# Patient Record
Sex: Female | Born: 1965 | Race: White | Hispanic: No | Marital: Married | Smoking: Never smoker
Health system: Southern US, Community
[De-identification: ages and names within clinical notes are randomized; demographics above are authoritative.]

## PROBLEM LIST (undated history)

## (undated) DIAGNOSIS — E079 Disorder of thyroid, unspecified: Secondary | ICD-10-CM

## (undated) DIAGNOSIS — G43909 Migraine, unspecified, not intractable, without status migrainosus: Secondary | ICD-10-CM

## (undated) HISTORY — PX: APPENDECTOMY: SHX54

## (undated) HISTORY — PX: TONSILLECTOMY: SUR1361

## (undated) HISTORY — PX: TUBAL LIGATION: SHX77

---

## 2015-05-06 ENCOUNTER — Ambulatory Visit
Admission: EM | Admit: 2015-05-06 | Discharge: 2015-05-06 | Disposition: A | Discharge status: Home / Self Care | Payer: Federal, State, Local not specified - PPO | Attending: Family Medicine | Admitting: Family Medicine

## 2015-05-06 ENCOUNTER — Encounter: Payer: Self-pay | Admitting: Emergency Medicine

## 2015-05-06 DIAGNOSIS — L03211 Cellulitis of face: Secondary | ICD-10-CM

## 2015-05-06 HISTORY — DX: Disorder of thyroid, unspecified: E07.9

## 2015-05-06 HISTORY — DX: Migraine, unspecified, not intractable, without status migrainosus: G43.909

## 2015-05-06 MED ORDER — SULFAMETHOXAZOLE-TRIMETHOPRIM 800-160 MG PO TABS: 1.0000 | ORAL_TABLET | Freq: Two times a day (BID) | ORAL | Status: DC

## 2015-05-06 NOTE — ED Notes (Signed)
Pt with swelling left side of face x 2 days and a fever

## 2015-05-06 NOTE — Discharge Instructions (Signed)

## 2015-05-06 NOTE — ED Provider Notes (Signed)
CSN: [id number]     Arrival date & time 05/06/15  1429 History   First MD Initiated Contact with Patient 05/06/15 1502     Chief Complaint  Patient presents with  . Facial Pain   (Consider location/radiation/quality/duration/timing/severity/associated sxs/prior Treatment) HPI Comments: 49 yo female with a 2 days h/o left sided facial skin pain, redness and fevers. Denies any trauma, injuries, known insect bite, sick contacts, drainage, congestion, earache, cough, sore throat.   The history is provided by the patient.    Past Medical History  Diagnosis Date  . Thyroid disease   . Migraine    Past Surgical History  Procedure Laterality Date  . Tonsillectomy    . Appendectomy    . Tubal ligation     Family History  Problem Relation Age of Onset  . Cancer Mother   . Cancer Father    History  Substance Use Topics  . Smoking status: Never Smoker   . Smokeless tobacco: Never Used  . Alcohol Use: No   OB History    No data available     Review of Systems  Allergies  Rocephin  Home Medications   Prior to Admission medications   Medication Sig Start Date End Date Taking? Authorizing Provider  levothyroxine (SYNTHROID, LEVOTHROID) 25 MCG tablet Take 25 mcg by mouth daily before breakfast.   Yes Historical Provider, MD  magnesium 30 MG tablet Take 30 mg by mouth daily.   Yes Historical Provider, MD  sulfamethoxazole-trimethoprim (BACTRIM DS,SEPTRA DS) 800-160 MG per tablet Take 1 tablet by mouth 2 (two) times daily. 05/06/15   Payton Mccallum, MD   BP 136/84 mmHg  Pulse 123  Temp(Src) 101.8 F (38.8 C) (Oral)  Resp 18  Ht  (1.6 m)  Wt 180 lb (81.647 kg)  BMI 31.89 kg/m2  SpO2 98% Physical Exam  Constitutional: She appears well-developed and well-nourished. No distress.  Skin: Rash noted. She is not diaphoretic. There is erythema.  6x3cm skin area on left side face and jaw line with blanchable erythema, warmth, and tenderness to palpation; no drainage or vesicular  lesions noted.   Nursing note and vitals reviewed.   ED Course  Procedures (including critical care time) Labs Review Labs Reviewed - No data to display  Imaging Review No results found.   MDM   1. Cellulitis, face   (left)  Discharge Medication List as of 05/06/2015  3:22 PM    START taking these medications   Details  sulfamethoxazole-trimethoprim (BACTRIM DS,SEPTRA DS) 800-160 MG per tablet Take 1 tablet by mouth 2 (two) times daily., Starting 05/06/2015, Until Discontinued, Normal       Plan: 1. diagnosis reviewed with patient 2. rx as per orders; risks, benefits, potential side effects reviewed with patient; (note: allergic to cephalosporins) 3. Recommend supportive treatment with warm compresses to area, otc analgesics 4. close monitoring and to ED if symptoms worsen 5.  F/u prn if symptoms worsen or don't improve    Payton Mccallum, MD 05/06/15 1530

## 2015-08-13 ENCOUNTER — Other Ambulatory Visit: Payer: Self-pay | Admitting: Neurology

## 2015-08-13 DIAGNOSIS — R51 Headache: Principal | ICD-10-CM

## 2015-08-13 DIAGNOSIS — R519 Headache, unspecified: Secondary | ICD-10-CM

## 2015-08-21 ENCOUNTER — Ambulatory Visit: Admission: RE | Admit: 2015-08-21 | Payer: Federal, State, Local not specified - PPO | Source: Ambulatory Visit

## 2015-08-30 ENCOUNTER — Ambulatory Visit
Admission: RE | Admit: 2015-08-30 | Discharge: 2015-08-30 | Disposition: A | Discharge status: Home / Self Care | Payer: Federal, State, Local not specified - PPO | Source: Ambulatory Visit | Attending: Neurology | Admitting: Neurology

## 2015-08-30 DIAGNOSIS — R51 Headache: Secondary | ICD-10-CM | POA: Insufficient documentation

## 2015-08-30 DIAGNOSIS — R519 Headache, unspecified: Secondary | ICD-10-CM

## 2015-08-30 LAB — CBC
HCT: 41.3 % (ref 35.0–47.0)
Hemoglobin: 13.5 g/dL (ref 12.0–16.0)
MCH: 30 pg (ref 26.0–34.0)
MCHC: 32.7 g/dL (ref 32.0–36.0)
MCV: 91.9 fL (ref 80.0–100.0)
PLATELETS: 294 10*3/uL (ref 150–440)
RBC: 4.49 MIL/uL (ref 3.80–5.20)
RDW: 13.2 % (ref 11.5–14.5)
WBC: 5.9 10*3/uL (ref 3.6–11.0)

## 2015-08-30 LAB — CSF CELL COUNT WITH DIFFERENTIAL
Eosinophils, CSF: 0 %
LYMPHS CSF: 0 %
Monocyte-Macrophage-Spinal Fluid: 0 %
Other Cells, CSF: 0
RBC COUNT CSF: 13 /mm3 — AB (ref 0–3)
SEGMENTED NEUTROPHILS-CSF: 0 %
TUBE #: 3
WBC, CSF: 0 /mm3

## 2015-08-30 LAB — PROTIME-INR
INR: 0.95
Prothrombin Time: 12.9 seconds (ref 11.4–15.0)

## 2015-08-30 LAB — ALBUMIN: Albumin: 4.1 g/dL (ref 3.5–5.0)

## 2015-08-30 LAB — APTT: aPTT: 28 seconds (ref 24–36)

## 2015-08-30 LAB — GLUCOSE, CSF: Glucose, CSF: 62 mg/dL (ref 40–70)

## 2015-08-30 LAB — PROTEIN, CSF: TOTAL PROTEIN, CSF: 31 mg/dL (ref 15–45)

## 2015-08-30 NOTE — Progress Notes (Signed)
Pt doing ok post procedure, husband present, no changes, bandade dressing intact,discharge instructions given with questions answered,

## 2015-08-30 NOTE — Discharge Instructions (Signed)
Lumbar Puncture, Care After °Refer to this sheet in the next few weeks. These instructions provide you with information on caring for yourself after your procedure. Your health care provider may also give you more specific instructions. Your treatment has been planned according to current medical practices, but problems sometimes occur. Call your health care provider if you have any problems or questions after your procedure. °WHAT TO EXPECT AFTER THE PROCEDURE °After your procedure, it is typical to have the following sensations: °· Mild discomfort or pain at the insertion site. °· Mild headache that is relieved with pain medicines. °HOME CARE INSTRUCTIONS °· Avoid lifting anything heavier than 10 lb (4.5 kg) for at least 12 hours after the procedure. °· Drink enough fluids to keep your urine clear or pale yellow. °SEEK MEDICAL CARE IF: °· You have fever or chills. °· You have nausea or vomiting. °· You have a headache that lasts for more than 2 days. °SEEK IMMEDIATE MEDICAL CARE IF: °· You have any numbness or tingling in your legs. °· You are unable to control your bowel or bladder. °· You have bleeding or swelling in your back at the insertion site. °· You are dizzy or faint. °  °This information is not intended to replace advice given to you by your health care provider. Make sure you discuss any questions you have with your health care provider. °  °Document Released: 09/19/2013 Document Reviewed: 09/19/2013 °Elsevier Interactive Patient Education ©2016 Elsevier Inc[employer] ° °

## 2015-09-03 LAB — CSF CULTURE: Culture: NO GROWTH

## 2015-09-03 LAB — CSF CULTURE W GRAM STAIN

## 2015-09-04 LAB — LYME, WESTERN BLOT, SERUM (REFLEXED)
IGG P28 AB.: ABSENT
IGG P45 AB.: ABSENT
IGG P66 AB.: ABSENT
IgG P23 Ab.: ABSENT
IgG P30 Ab.: ABSENT
IgM P39 Ab.: ABSENT
LYME IGG WB: POSITIVE — AB
LYME IGM WB: POSITIVE — AB

## 2015-09-04 LAB — IGG CSF INDEX
Albumin CSF-mCnc: 24 mg/dL (ref 11–48)
Albumin: 4.3 g/dL (ref 3.5–5.5)
CSF IgG Index: 0.5 (ref 0.0–0.7)
IGG (IMMUNOGLOBIN G), SERUM: 1160 mg/dL (ref 700–1600)
IGG CSF: 3.1 mg/dL (ref 0.0–8.6)
IgG/Alb Ratio, CSF: 0.13 (ref 0.00–0.25)

## 2015-09-04 LAB — B. BURGDORFI ANTIBODIES: B burgdorferi Ab IgG+IgM: 1.56 {ISR} — ABNORMAL HIGH (ref 0.00–0.90)

## 2015-09-06 LAB — OLIGOCLONAL BANDS, CSF + SERM

## 2015-09-06 LAB — JC VIRUS, PCR CSF: JC VIRUS PCR (CSF): NEGATIVE

## 2015-10-25 ENCOUNTER — Other Ambulatory Visit: Payer: Self-pay | Admitting: Neurology

## 2015-10-25 DIAGNOSIS — G44221 Chronic tension-type headache, intractable: Secondary | ICD-10-CM

## 2015-11-14 ENCOUNTER — Ambulatory Visit
Admission: RE | Admit: 2015-11-14 | Discharge: 2015-11-14 | Disposition: A | Discharge status: Home / Self Care | Payer: Federal, State, Local not specified - PPO | Source: Ambulatory Visit | Attending: Neurology | Admitting: Neurology

## 2015-11-14 DIAGNOSIS — G44221 Chronic tension-type headache, intractable: Secondary | ICD-10-CM | POA: Insufficient documentation

## 2016-01-08 ENCOUNTER — Ambulatory Visit
Admission: EM | Admit: 2016-01-08 | Discharge: 2016-01-08 | Disposition: A | Discharge status: Home / Self Care | Payer: Federal, State, Local not specified - PPO | Attending: Family Medicine | Admitting: Family Medicine

## 2016-01-08 ENCOUNTER — Encounter: Payer: Self-pay | Admitting: Emergency Medicine

## 2016-01-08 DIAGNOSIS — G43711 Chronic migraine without aura, intractable, with status migrainosus: Secondary | ICD-10-CM | POA: Diagnosis not present

## 2016-01-08 MED ORDER — ONDANSETRON 8 MG PO TBDP
8.0000 mg | ORAL_TABLET | Freq: Once | ORAL | Status: AC
  Administered 2016-01-08: 8 mg via ORAL

## 2016-01-08 MED ORDER — KETOROLAC TROMETHAMINE 60 MG/2ML IM SOLN
60.0000 mg | Freq: Once | INTRAMUSCULAR | Status: AC
  Administered 2016-01-08: 60 mg via INTRAMUSCULAR

## 2016-01-08 MED ORDER — SUMATRIPTAN SUCCINATE 6 MG/0.5ML ~~LOC~~ SOLN
6.0000 mg | Freq: Once | SUBCUTANEOUS | Status: AC
  Administered 2016-01-08: 6 mg via SUBCUTANEOUS

## 2016-01-08 MED ORDER — ONDANSETRON 8 MG PO TBDP: 8.0000 mg | ORAL_TABLET | Freq: Three times a day (TID) | ORAL | Status: DC | PRN

## 2016-01-08 NOTE — ED Provider Notes (Signed)
CSN: [id number]     Arrival date & time 01/08/16  1132 History   First MD Initiated Contact with Patient 01/08/16 1205    Nurses notes were reviewed. Chief Complaint  Patient presents with  . Migraine  . Dizziness   Patient's here because migraine headache. She rates reports having chronic migraines. She sees and is followed by neurologist. She states she has daily headaches with the anti-seizure-like medication which she cannot give [place] the name does not seem to be helping at all. She states that she's had headache for several days. She try to get in to see a neurologist today and recommended her going to urgent care. She's had MRIs in the last few years and looking up her MRI shows no acute changes other than some nonspecific findings the [address] [place] sick to be compatible with some with migraines. She's had nausea as well. She is allergic to Rocephin has been on Imitrex before practice doesn't smoke work for her and she does have nausea today. He states that she is not aware of ever taking Toradol either.   She's had tonsillectomy appendectomy tubal ligation. She never smoked. Mother father both had cancer.    (Consider location/radiation/quality/duration/timing/severity/associated sxs/prior Treatment) Patient is a 50 y.o. female presenting with migraines and dizziness. The history is provided by the patient. No language interpreter was used.  Migraine This is a chronic problem. The current episode started more than 2 days ago. The problem occurs constantly. The problem has been gradually worsening. Associated symptoms include headaches. Pertinent negatives include no chest pain, no abdominal pain and no shortness of breath. Nothing aggravates the symptoms. Nothing relieves the symptoms. The treatment provided no relief.  Dizziness Associated symptoms: headaches and nausea   Associated symptoms: no chest pain and no shortness of breath     Past Medical History  Diagnosis Date  . Thyroid  disease   . Migraine    Past Surgical History  Procedure Laterality Date  . Tonsillectomy    . Appendectomy    . Tubal ligation     Family History  Problem Relation Age of Onset  . Cancer Mother   . Cancer Father    Social History  Substance Use Topics  . Smoking status: Never Smoker   . Smokeless tobacco: Never Used  . Alcohol Use: No   OB History    No data available     Review of Systems  Constitutional: Positive for activity change and appetite change.  Eyes: Positive for photophobia.  Respiratory: Negative for shortness of breath.   Cardiovascular: Negative for chest pain.  Gastrointestinal: Positive for nausea. Negative for abdominal pain.  Neurological: Positive for dizziness and headaches.  All other systems reviewed and are negative.   Allergies  Rocephin  Home Medications   Prior to Admission medications   Medication Sig Start Date End Date Taking? Authorizing Provider  dimenhyDRINATE (DRAMAMINE) 50 MG tablet Take 50 mg by mouth every 8 (eight) hours as needed.    Historical Provider, MD  EPINEPHrine 0.3 mg/0.3 mL IJ SOAJ injection Inject 0.3 mg into the muscle as needed.    Historical Provider, MD  levothyroxine (SYNTHROID, LEVOTHROID) 25 MCG tablet Take 25 mcg by mouth daily before breakfast.    Historical Provider, MD  magnesium 30 MG tablet Take 30 mg by mouth daily.    Historical Provider, MD  ondansetron (ZOFRAN ODT) 8 MG disintegrating tablet Take 1 tablet (8 mg total) by mouth every 8 (eight) hours as needed for nausea  or vomiting. 01/08/16   Hassan RowanEugene Chrystina Naff, MD  sulfamethoxazole-trimethoprim (BACTRIM DS,SEPTRA DS) 800-160 MG per tablet Take 1 tablet by mouth 2 (two) times daily. 05/06/15   Payton Mccallumrlando Conty, MD   Meds Ordered and Administered this Visit   Medications  ondansetron (ZOFRAN-ODT) disintegrating tablet 8 mg (8 mg Oral Given 01/08/16 1232)  ketorolac (TORADOL) injection 60 mg (60 mg Intramuscular Given 01/08/16 1233)  SUMAtriptan (IMITREX)  injection 6 mg (6 mg Subcutaneous Given 01/08/16 1234)    BP 139/75 mmHg  Pulse 80  Temp(Src) 97.9 F (36.6 C) (Oral)  Resp 18  Ht 5\' 3"  (1.6 m)  Wt 180 lb (81.647 kg)  BMI 31.89 kg/m2  SpO2 99% Orthostatic VS for the past 24 hrs:  BP- Lying Pulse- Lying BP- Sitting Pulse- Sitting BP- Standing at 0 minutes Pulse- Standing at 0 minutes  01/08/16 1213 125/67 mmHg 77 140/77 mmHg 77 136/84 mmHg 82    Physical Exam  Constitutional: She is oriented to person, place, and time. She appears well-developed and well-nourished.  HENT:  Head: Normocephalic and atraumatic.  Right Ear: External ear normal.  Left Ear: External ear normal.  Mouth/Throat: Oropharynx is clear and moist.  Eyes: Conjunctivae are normal. Pupils are equal, round, and reactive to light.  Neck: Normal range of motion. Neck supple.  Musculoskeletal: Normal range of motion. She exhibits no tenderness.  Lymphadenopathy:    She has no cervical adenopathy.  Neurological: She is alert and oriented to person, place, and time.  Skin: Skin is warm and dry.  Psychiatric: She has a normal mood and affect.  Vitals reviewed.   ED Course  Procedures (including critical care time)  Labs Review Labs Reviewed - No data to display  Imaging Review No results found.   Visual Acuity Review  Right Eye Distance:   Left Eye Distance:   Bilateral Distance:    Right Eye Near:   Left Eye Near:    Bilateral Near:         MDM   1. Intractable chronic migraine without aura and with status migrainosus      Since patient is only allergic to Rocephin will give 60 Toradol IM subcutaneous Imitrex 6 mg and Zofran 8 mg ODT and see if that helps.Patient reports not nothing helped much. She does not have a driver so no narcotic or Phenergan can be given. Offered to give patient another injection Imitrex and she wanted to wait for another 15-20 minutes she's client states was goal to bed and laid down. Starting suggested follow-up  with a neurologist and PCP will allow to go home. We'll prescribe Zofran sublingual to use on when necessary basis every 8 hours when necessary for nausea if needed.   Note: This dictation was prepared with Dragon dictation along with smaller phrase technology. Any transcriptional errors that result from this process are unintentional.  Hassan RowanEugene Paymon Rosensteel, MD 01/08/16 1304

## 2016-01-08 NOTE — Discharge Instructions (Signed)
Recurrent Migraine Headache °A migraine headache is very bad, throbbing pain on one or both sides of your head. Recurrent migraines keep coming back. Talk to your doctor about what things may bring on (trigger) your migraine headaches. °HOME CARE °· Only take medicines as told by your doctor. °· Lie down in a dark, quiet room when you have a migraine. °· Keep a journal to find out if certain things bring on migraine headaches. For example, write down: °¨ What you eat and drink. °¨ How much sleep you get. °¨ Any change to your diet or medicines. °· Lessen how much alcohol you drink. °· Quit smoking if you smoke. °· Get enough sleep. °· Lessen any stress in your life. °· Keep lights dim if bright lights bother you or make your migraines worse. °GET HELP IF: °· Medicine does not help your migraines. °· Your pain keeps coming back. °· You have a fever. °GET HELP RIGHT AWAY IF:  °· Your migraine becomes really bad. °· You have a stiff neck. °· You have trouble seeing. °· Your muscles are weak, or you lose muscle control. °· You lose your balance or have trouble walking. °· You feel like you will pass out (faint), or you pass out. °· You have really bad symptoms that are different than your first symptoms. °MAKE SURE YOU:  °· Understand these instructions. °· Will watch your condition. °· Will get help right away if you are not doing well or get worse. °  °This information is not intended to replace advice given to you by your health care provider. Make sure you discuss any questions you have with your health care provider. °  °Document Released: 06/23/2008 Document Revised: 09/19/2013 Document Reviewed: 05/22/2013 °Elsevier Interactive Patient Education ©2016 Elsevier Inc. ° °

## 2016-01-08 NOTE — ED Notes (Signed)
Pt reports she has constant headaches "24/7 that never go away" but the past couple of days this has been "ramping up" and getting worse. Pt has headache, dizziness, nausea but no vomiting, photophobia.  Pt sees a neurologist, but can't tell me which meds she has taken.

## 2016-01-08 NOTE — ED Notes (Signed)
Pt asked if a second shot of imitrex would help and stated it could. Pt said she just wanted to go home and sleep and didn't want to stay. Pt walked out. Informed she can return if needed tomorrow.

## 2016-05-20 ENCOUNTER — Encounter: Payer: Self-pay | Admitting: *Deleted

## 2016-05-20 ENCOUNTER — Ambulatory Visit
Admission: EM | Admit: 2016-05-20 | Discharge: 2016-05-20 | Disposition: A | Discharge status: Home / Self Care | Payer: Federal, State, Local not specified - PPO | Attending: Family Medicine | Admitting: Family Medicine

## 2016-05-20 DIAGNOSIS — G43009 Migraine without aura, not intractable, without status migrainosus: Secondary | ICD-10-CM | POA: Diagnosis not present

## 2016-05-20 MED ORDER — ONDANSETRON 8 MG PO TBDP
8.0000 mg | ORAL_TABLET | Freq: Once | ORAL | Status: AC
  Administered 2016-05-20: 8 mg via ORAL

## 2016-05-20 MED ORDER — HYDROCODONE-ACETAMINOPHEN 5-325 MG PO TABS: ORAL_TABLET | ORAL | 0 refills | Status: DC

## 2016-05-20 MED ORDER — KETOROLAC TROMETHAMINE 60 MG/2ML IM SOLN
60.0000 mg | Freq: Once | INTRAMUSCULAR | Status: AC
  Administered 2016-05-20: 60 mg via INTRAMUSCULAR

## 2016-05-20 NOTE — ED Triage Notes (Signed)
Patient has had a continual severe migraine headache for 25 days straight. She is being treated by a neurologist and is awaiting a referral to a headache specialist. Patient has a 16 year history of reoccurring migraines.

## 2016-05-20 NOTE — ED Provider Notes (Signed)
[facility]    CSN: [id number] Arrival date & time: 05/20/16  1036  First Provider Contact:  None       History   Chief Complaint Chief Complaint  Patient presents with  . Migraine    HPI Kellie Cantrell is a 50 y.o. female.   The history is provided by the patient.   Patient has had a continual severe migraine headache for 25 days straight. She is being treated by a neurologist and is awaiting a referral to a headache specialist. Patient has a 16 year history of reoccurring migraines. Reports typical migraine headache associated with photophobia and nausea/vomiting. Last vomited last night. Denies any numbness/tingling, fevers, chills, neck stiffness.   Past Medical History:  Diagnosis Date  . Migraine   . Thyroid disease     There are no active problems to display for this patient.   Past Surgical History:  Procedure Laterality Date  . APPENDECTOMY    . TONSILLECTOMY    . TUBAL LIGATION      OB History    No data available       Home Medications    Prior to Admission medications   Medication Sig Start Date End Date Taking? Authorizing Provider  dimenhyDRINATE (DRAMAMINE) 50 MG tablet Take 50 mg by mouth every 8 (eight) hours as needed.   Yes Historical Provider, MD  levothyroxine (SYNTHROID, LEVOTHROID) 25 MCG tablet Take 25 mcg by mouth daily before breakfast.   Yes Historical Provider, MD  magnesium 30 MG tablet Take 30 mg by mouth daily.   Yes Historical Provider, MD  EPINEPHrine 0.3 mg/0.3 mL IJ SOAJ injection Inject 0.3 mg into the muscle as needed.    Historical Provider, MD  HYDROcodone-acetaminophen (NORCO/VICODIN) 5-325 MG tablet 1-2 tabs po qd prn 05/20/16   Payton Mccallumrlando Liv Rallis, MD  ondansetron (ZOFRAN ODT) 8 MG disintegrating tablet Take 1 tablet (8 mg total) by mouth every 8 (eight) hours as needed for nausea or vomiting. 01/08/16   Hassan RowanEugene Wade, MD  sulfamethoxazole-trimethoprim (BACTRIM DS,SEPTRA DS) 800-160 MG per tablet Take 1 tablet by mouth  2 (two) times daily. 05/06/15   Payton Mccallumrlando Suhas Estis, MD    Family History Family History  Problem Relation Age of Onset  . Cancer Mother   . Cancer Father     Social History Social History  Substance Use Topics  . Smoking status: Never Smoker  . Smokeless tobacco: Never Used  . Alcohol use No     Allergies   Rocephin [ceftriaxone]   Review of Systems Review of Systems   Physical Exam Triage Vital Signs ED Triage Vitals  Enc Vitals Group     BP 05/20/16 1049 120/69     Pulse Rate 05/20/16 1049 94     Resp 05/20/16 1049 20     Temp 05/20/16 1049 98 F (36.7 C)     Temp Source 05/20/16 1049 Oral     SpO2 05/20/16 1049 96 %     Weight 05/20/16 1049 190 lb (86.2 kg)     Height 05/20/16 1049 5\' 3"  (1.6 m)     Head Circumference --      Peak Flow --      Pain Score 05/20/16 1054 10     Pain Loc --      Pain Edu? --      Excl. in GC? --    No data found.   Updated Vital Signs BP 120/69 (BP Location: Right Arm)   Pulse 94   Temp  98 F (36.7 C) (Oral)   Resp 20   Ht 5\' 3"  (1.6 m)   Wt 190 lb (86.2 kg)   SpO2 96%   BMI 33.66 kg/m   Visual Acuity Right Eye Distance:   Left Eye Distance:   Bilateral Distance:    Right Eye Near:   Left Eye Near:    Bilateral Near:     Physical Exam  Constitutional: She is oriented to person, place, and time. She appears well-developed and well-nourished. No distress.  HENT:  Head: Normocephalic.  Right Ear: Tympanic membrane, external ear and ear canal normal.  Left Ear: Tympanic membrane, external ear and ear canal normal.  Nose: Nose normal.  Mouth/Throat: Oropharynx is clear and moist and mucous membranes are normal. No oropharyngeal exudate.  Eyes: Conjunctivae and EOM are normal. Pupils are equal, round, and reactive to light. Right eye exhibits no discharge. Left eye exhibits no discharge. No scleral icterus.  Neck: Normal range of motion. Neck supple. No JVD present. No tracheal deviation present. No thyromegaly  present.  Cardiovascular: Normal rate, regular rhythm, normal heart sounds and intact distal pulses.   No murmur heard. Pulmonary/Chest: Effort normal and breath sounds normal. No stridor. No respiratory distress. She has no wheezes. She has no rales. She exhibits no tenderness.  Musculoskeletal: She exhibits no edema or tenderness.  Lymphadenopathy:    She has no cervical adenopathy.  Neurological: She is alert and oriented to person, place, and time. She has normal reflexes. She displays normal reflexes. No cranial nerve deficit. She exhibits normal muscle tone. Coordination normal.  Skin: Skin is warm and dry. No rash noted. She is not diaphoretic. No erythema. No pallor.  Psychiatric: She has a normal mood and affect. Her behavior is normal. Judgment and thought content normal.  Vitals reviewed.    UC Treatments / Results  Labs (all labs ordered are listed, but only abnormal results are displayed) Labs Reviewed - No data to display  EKG  EKG Interpretation None       Radiology No results found.  Procedures Procedures (including critical care time)  Medications Ordered in UC Medications  ketorolac (TORADOL) injection 60 mg (60 mg Intramuscular Given 05/20/16 1149)  ondansetron (ZOFRAN-ODT) disintegrating tablet 8 mg (8 mg Oral Given 05/20/16 1148)     Initial Impression / Assessment and Plan / UC Course  I have reviewed the triage vital signs and the nursing notes.  Pertinent labs & imaging results that were available during my care of the patient were reviewed by me and considered in my medical decision making (see chart for details).  Clinical Course      Final Clinical Impressions(s) / UC Diagnoses   Final diagnoses:  Nonintractable migraine, unspecified migraine type    New Prescriptions Discharge Medication List as of 05/20/2016 12:41 PM    START taking these medications   Details  HYDROcodone-acetaminophen (NORCO/VICODIN) 5-325 MG tablet 1-2 tabs po  qd prn, Print        1.  diagnosis reviewed with patient 2. rx as per orders above; reviewed possible side effects, interactions, risks and benefits  3. Recommend supportive treatment with otc analgesics prn 4. Patient given toradol 60mg  IM x 1 and zofran 8mg  odt with improvement of symptoms 5. Follow-up with neurologist/headache specialist    Payton Mccallumrlando Joziah Dollins, MD 05/20/16 [phone]

## 2016-06-15 ENCOUNTER — Other Ambulatory Visit: Payer: Self-pay | Admitting: Obstetrics and Gynecology

## 2016-06-17 ENCOUNTER — Other Ambulatory Visit: Payer: Self-pay | Admitting: Obstetrics and Gynecology

## 2016-06-17 ENCOUNTER — Inpatient Hospital Stay
Admission: RE | Admit: 2016-06-17 | Discharge: 2016-06-17 | Disposition: A | Discharge status: Home / Self Care | Payer: Self-pay | Source: Ambulatory Visit | Attending: *Deleted | Admitting: *Deleted

## 2016-06-17 ENCOUNTER — Other Ambulatory Visit: Payer: Self-pay | Admitting: *Deleted

## 2016-06-17 DIAGNOSIS — Z9289 Personal history of other medical treatment: Secondary | ICD-10-CM

## 2016-06-17 DIAGNOSIS — Z1231 Encounter for screening mammogram for malignant neoplasm of breast: Secondary | ICD-10-CM

## 2016-07-20 ENCOUNTER — Ambulatory Visit
Admission: RE | Admit: 2016-07-20 | Discharge: 2016-07-20 | Disposition: A | Discharge status: Home / Self Care | Payer: Federal, State, Local not specified - PPO | Source: Ambulatory Visit | Attending: Obstetrics and Gynecology | Admitting: Obstetrics and Gynecology

## 2016-07-20 ENCOUNTER — Other Ambulatory Visit: Payer: Self-pay | Admitting: Obstetrics and Gynecology

## 2016-07-20 DIAGNOSIS — Z1231 Encounter for screening mammogram for malignant neoplasm of breast: Secondary | ICD-10-CM | POA: Diagnosis present

## 2016-07-20 LAB — HM PAP SMEAR: HM PAP: NORMAL

## 2017-11-23 ENCOUNTER — Other Ambulatory Visit: Payer: Self-pay

## 2017-11-23 ENCOUNTER — Ambulatory Visit
Admission: EM | Admit: 2017-11-23 | Discharge: 2017-11-23 | Disposition: A | Discharge status: Home / Self Care | Payer: Federal, State, Local not specified - PPO | Attending: Family Medicine | Admitting: Family Medicine

## 2017-11-23 ENCOUNTER — Encounter: Payer: Self-pay | Admitting: Emergency Medicine

## 2017-11-23 DIAGNOSIS — R69 Illness, unspecified: Secondary | ICD-10-CM | POA: Diagnosis not present

## 2017-11-23 DIAGNOSIS — J111 Influenza due to unidentified influenza virus with other respiratory manifestations: Secondary | ICD-10-CM

## 2017-11-23 LAB — RAPID STREP SCREEN (MED CTR MEBANE ONLY): Streptococcus, Group A Screen (Direct): NEGATIVE

## 2017-11-23 MED ORDER — HYDROCOD POLST-CPM POLST ER 10-8 MG/5ML PO SUER: 5.0000 mL | Freq: Two times a day (BID) | ORAL | 0 refills | Status: DC | PRN

## 2017-11-23 NOTE — Discharge Instructions (Signed)
Rest, fluids.  Tylenol as needed for fever.  Tussionex for cough.  Take care  Dr. Adriana Simasook

## 2017-11-23 NOTE — ED Triage Notes (Signed)
Patient c/o cough and chest congestion, and fever since Saturday.

## 2017-11-23 NOTE — ED Provider Notes (Signed)
[facility]    CSN: [id number] Arrival date & time: 11/23/17  0859  History   Chief Complaint Chief Complaint  Patient presents with  . Cough  . Fever   HPI  52 year old female presents with fever, cough, sore throat.  Started Saturday. Reports cough and fever (T max 102). Associated body aches. Pain is 8/10 in severity. Now experiencing sore throat.  No reported sick contacts.  No known exacerbating or relieving factors.  No other associated symptoms.  No other complaints at this time.  Past Medical History:  Diagnosis Date  . Migraine   . Thyroid disease    Past Surgical History:  Procedure Laterality Date  . APPENDECTOMY    . TONSILLECTOMY    . TUBAL LIGATION     OB History    No data available     Home Medications    Prior to Admission medications   Medication Sig Start Date End Date Taking? Authorizing Provider  levothyroxine (SYNTHROID, LEVOTHROID) 25 MCG tablet Take 25 mcg by mouth daily before breakfast.   Yes [provider]  magnesium 30 MG tablet Take 30 mg by mouth daily.   Yes [provider]  chlorpheniramine-HYDROcodone (TUSSIONEX PENNKINETIC ER) 10-8 MG/5ML SUER Take 5 mLs by mouth every 12 (twelve) hours as needed. 11/23/17   Tommie Samsook, Ricky Doan G, DO  dimenhyDRINATE (DRAMAMINE) 50 MG tablet Take 50 mg by mouth every 8 (eight) hours as needed.    [provider]  EPINEPHrine 0.3 mg/0.3 mL IJ SOAJ injection Inject 0.3 mg into the muscle as needed.    [provider]  ondansetron (ZOFRAN ODT) 8 MG disintegrating tablet Take 1 tablet (8 mg total) by mouth every 8 (eight) hours as needed for nausea or vomiting. 01/08/16   Hassan RowanWade, Eugene, MD    Family History Family History  Problem Relation Age of Onset  . Cancer Mother   . Breast cancer Mother 858  . Cancer Father     Social History Social History   Tobacco Use  . Smoking status: Never Smoker  . Smokeless tobacco: Never Used  Substance Use Topics  . Alcohol  use: No  . Drug use: No     Allergies   Rocephin [ceftriaxone]   Review of Systems Review of Systems  Constitutional: Positive for fever.  HENT: Positive for sore throat.   Respiratory: Positive for cough.   Musculoskeletal:       Bodyaches.   Physical Exam Triage Vital Signs ED Triage Vitals  Enc Vitals Group     BP 11/23/17 0925 128/88     Pulse Rate 11/23/17 0925 92     Resp 11/23/17 0925 16     Temp 11/23/17 0925 98.1 F (36.7 C)     Temp Source 11/23/17 0925 Oral     SpO2 11/23/17 0925 97 %     Weight 11/23/17 0922 193 lb (87.5 kg)     Height 11/23/17 0922 5\' 3"  (1.6 m)     Head Circumference --      Peak Flow --      Pain Score 11/23/17 0922 8     Pain Loc --      Pain Edu? --      Excl. in GC? --    Updated Vital Signs BP 128/88 (BP Location: Left Arm)   Pulse 92   Temp 98.1 F (36.7 C) (Oral)   Resp 16   Ht 5\' 3"  (1.6 m)   Wt 193 lb (87.5  kg)   SpO2 97%   BMI 34.19 kg/m    Physical Exam  Constitutional: She is oriented to person, place, and time. She appears well-developed. No distress.  HENT:  Head: Normocephalic and atraumatic.  Nose: Nose normal.  Oropharynx with mild erythema.  Eyes: Conjunctivae are normal. Right eye exhibits no discharge. Left eye exhibits no discharge.  Cardiovascular: Normal rate and regular rhythm.  Pulmonary/Chest: Effort normal and breath sounds normal. She has no wheezes. She has no rales.  Neurological: She is alert and oriented to person, place, and time.  Psychiatric: She has a normal mood and affect. Her behavior is normal.  Nursing note and vitals reviewed.  UC Treatments / Results  Labs (all labs ordered are listed, but only abnormal results are displayed) Labs Reviewed  RAPID STREP SCREEN (NOT AT [facility])  CULTURE, GROUP A STREP [facility])    EKG  EKG Interpretation None       Radiology No results found.  Procedures Procedures (including critical care time)  Medications Ordered in UC Medications -  No data to display   Initial Impression / Assessment and Plan / UC Course  I have reviewed the triage vital signs and the nursing notes.  Pertinent labs & imaging results that were available during my care of the patient were reviewed by me and considered in my medical decision making (see chart for details).     52 year old female presents with an influenza-like illness.  She is out of the window for treatment.  Does not appear to have secondary pneumonia.  Strep negative.  Treating cough with Tussionex.  Supportive care.  Work note given.  Final Clinical Impressions(s) / UC Diagnoses   Final diagnoses:  Influenza-like illness    ED Discharge Orders        Ordered    chlorpheniramine-HYDROcodone (TUSSIONEX PENNKINETIC ER) 10-8 MG/5ML SUER  Every 12 hours PRN     11/23/17 1026     Controlled Substance Prescriptions [place] Controlled Substance Registry consulted? Not Applicable   Tommie Sams, DO 11/23/17 1031

## 2017-11-26 LAB — CULTURE, GROUP A STREP (THRC)

## 2017-11-30 ENCOUNTER — Telehealth: Payer: Self-pay | Admitting: Emergency Medicine

## 2017-11-30 NOTE — Telephone Encounter (Signed)
Patient notified of lab result.  Patient verbalized understanding.  Patient states that her symptoms have improved.

## 2018-01-03 ENCOUNTER — Encounter: Payer: Self-pay | Admitting: Obstetrics and Gynecology

## 2018-01-03 ENCOUNTER — Other Ambulatory Visit: Payer: Self-pay | Admitting: Obstetrics and Gynecology

## 2018-01-03 ENCOUNTER — Ambulatory Visit (INDEPENDENT_AMBULATORY_CARE_PROVIDER_SITE_OTHER): Payer: Federal, State, Local not specified - PPO | Admitting: Obstetrics and Gynecology

## 2018-01-03 VITALS — BP 126/84 | Ht 63.0 in | Wt 196.0 lb

## 2018-01-03 DIAGNOSIS — Z124 Encounter for screening for malignant neoplasm of cervix: Secondary | ICD-10-CM

## 2018-01-03 DIAGNOSIS — Z1231 Encounter for screening mammogram for malignant neoplasm of breast: Secondary | ICD-10-CM

## 2018-01-03 DIAGNOSIS — Z01419 Encounter for gynecological examination (general) (routine) without abnormal findings: Secondary | ICD-10-CM

## 2018-01-03 DIAGNOSIS — Z1339 Encounter for screening examination for other mental health and behavioral disorders: Secondary | ICD-10-CM | POA: Diagnosis not present

## 2018-01-03 DIAGNOSIS — Z1331 Encounter for screening for depression: Secondary | ICD-10-CM

## 2018-01-03 NOTE — Progress Notes (Signed)
Routine Annual Gynecology Examination   PCP: [facility]  Chief Complaint  Patient presents with  . Annual Exam  . Gynecologic Exam    History of Present Illness: Patient is a 52 y.o. [id number] established patient female presents for annual exam. The patient has no complaints today.   Menses: Has not had a time period of > 1 year without menses. When her menses does come it lasts less than a week. It is not heavy nor painful.    Menopausal symptoms: denies hot flashes, maybe a little vaginal dryness.  Breast symptoms: denies  Last pap smear: 1.5 years ago.  Result Normal  Last mammogram: 1.5 years ago.  Result Normal  Past Medical History:  Diagnosis Date  . Migraine   . Thyroid disease     Past Surgical History:  Procedure Laterality Date  . APPENDECTOMY    . TONSILLECTOMY    . TUBAL LIGATION      Prior to Admission medications   Medication Sig Start Date End Date Taking? Authorizing Provider  cetirizine (ZYRTEC) 10 MG tablet Take by mouth.   Yes [provider]  chlorpheniramine-HYDROcodone (TUSSIONEX PENNKINETIC ER) 10-8 MG/5ML SUER Take 5 mLs by mouth every 12 (twelve) hours as needed. 11/23/17  Yes Cook, Jayce G, DO  Diclofenac Potassium (CAMBIA) 50 MG PACK Take by mouth.   Yes [provider]  EPINEPHrine 0.3 mg/0.3 mL IJ SOAJ injection Inject 0.3 mg into the muscle as needed.   Yes [provider]  famotidine (PEPCID) 20 MG tablet Take 20 mg by mouth 2 (two) times daily.   Yes [provider]  Gabapentin, Once-Daily, 600 MG TABS Take by mouth.   Yes [provider]  hydrOXYzine (ATARAX/VISTARIL) 25 MG tablet  10/21/16  Yes [provider]  levothyroxine (SYNTHROID, LEVOTHROID) 25 MCG tablet Take 25 mcg by mouth daily before breakfast.   Yes [provider]  magnesium 30 MG tablet Take 30 mg by mouth daily.   Yes [provider]  dimenhyDRINATE (DRAMAMINE) 50 MG tablet Take 50  mg by mouth every 8 (eight) hours as needed.    [provider]  tiZANidine (ZANAFLEX) 4 MG tablet  10/21/16   [provider]    Allergies  Allergen Reactions  . Rocephin [Ceftriaxone] Itching   Obstetric History: [id number]  Social History   Socioeconomic History  . Marital status: Married    Spouse name: Not on file  . Number of children: Not on file  . Years of education: Not on file  . Highest education level: Not on file  Occupational History  . Not on file  Social Needs  . Financial resource strain: Not on file  . Food insecurity:    Worry: Not on file    Inability: Not on file  . Transportation needs:    Medical: Not on file    Non-medical: Not on file  Tobacco Use  . Smoking status: Never Smoker  . Smokeless tobacco: Never Used  Substance and Sexual Activity  . Alcohol use: No  . Drug use: No  . Sexual activity: Yes    Birth control/protection: Post-menopausal  Lifestyle  . Physical activity:    Days per week: Not on file    Minutes per session: Not on file  . Stress: Not on file  Relationships  . Social connections:    Talks on phone: Not on file    Gets together: Not on file    Attends  religious service: Not on file    Active member of club or organization: Not on file    Attends meetings of clubs or organizations: Not on file    Relationship status: Not on file  . Intimate partner violence:    Fear of current or ex partner: Not on file    Emotionally abused: Not on file    Physically abused: Not on file    Forced sexual activity: Not on file  Other Topics Concern  . Not on file  Social History Narrative  . Not on file    Family History  Problem Relation Age of Onset  . Cancer Mother   . Breast cancer Mother 98  . Cancer Father     Review of Systems  Constitutional: Positive for malaise/fatigue. Negative for chills, diaphoresis, fever and weight loss.  HENT: Negative.   Eyes: Negative.   Respiratory: Negative.     Cardiovascular: Negative.   Gastrointestinal: Negative.   Genitourinary: Negative.   Musculoskeletal: Positive for joint pain. Negative for back pain, falls, myalgias and neck pain.  Skin: Positive for itching and rash (right arm and hand).  Neurological: Positive for headaches. Negative for dizziness, tingling, tremors, sensory change, speech change, focal weakness, seizures, loss of consciousness and weakness.  Endo/Heme/Allergies: Negative.   Psychiatric/Behavioral: Negative.      Physical Exam Vitals: BP 126/84   Ht 5\' 3"  (1.6 m)   Wt 196 lb (88.9 kg)   BMI 34.72 kg/m   Physical Exam  Constitutional: She is oriented to person, place, and time. She appears well-developed and well-nourished. No distress.  Genitourinary: Vagina normal and uterus normal. Pelvic exam was performed with patient supine. There is no rash, tenderness or lesion on the right labia. There is no rash, tenderness or lesion on the left labia. Right adnexum does not display mass, does not display tenderness and does not display fullness. Left adnexum does not display mass, does not display tenderness and does not display fullness. Cervix does not exhibit motion tenderness, lesion, polyp or nabothian cyst.   Uterus is mobile. Uterus is not enlarged, tender, exhibiting a mass or irregular (is regular).  Genitourinary Comments: Pelvic exam limited by patient's body habitus  HENT:  Head: Normocephalic and atraumatic.  Eyes: Conjunctivae are normal. No scleral icterus.  Neck: Normal range of motion. Neck supple. No thyromegaly present.  Cardiovascular: Normal rate and regular rhythm. Exam reveals no gallop.  No murmur heard. Pulmonary/Chest: Effort normal and breath sounds normal. No respiratory distress. She has no wheezes. She has no rales.  Abdominal: Soft. Bowel sounds are normal. She exhibits no distension and no mass. There is no tenderness. There is no rebound and no guarding.  Musculoskeletal: Normal range of  motion. She exhibits no edema.  Lymphadenopathy:    She has no cervical adenopathy.  Neurological: She is alert and oriented to person, place, and time. No cranial nerve deficit.  Skin: Skin is warm and dry. Rash (right hand/arm with vesicles which have opend, erythema, warmth, excoriations along her right forarm.   ) noted.  Psychiatric: She has a normal mood and affect. Her behavior is normal. Judgment normal.     Female chaperone present for pelvic and breast  portions of the physical exam  Results: AUDIT Questionnaire (screen for alcoholism): 0 PHQ-9: 13 (she state that all scores of 3 are due to unrelenting chronic pain). She has a long history of intractable migraines and seldom gets any relief.    Assessment and Plan:  52 y.o. [id number] female here for routine annual gynecologic examination  Plan: Problem List Items Addressed This Visit    None    Visit Diagnoses    Women's annual routine gynecological examination    -  Primary   Relevant Orders   IGP, Aptima HPV, rfx 16/18,45   Screening for depression       Screening for alcoholism       Pap smear for cervical cancer screening       Relevant Orders   IGP, Aptima HPV, rfx 16/18,45      Screening: -- Blood pressure screen normal -- Colonoscopy - not due -- Mammogram - due. Patient to call [facility] to arrange. She understands that it is her responsibility to arrange this. -- Weight screening: obese: discussed management options, including lifestyle, dietary, and exercise. -- Depression screening negative (PHQ-9) -- Nutrition: normal -- cholesterol screening: per PCP -- osteoporosis screening: not due -- tobacco screening: not using -- alcohol screening: AUDIT questionnaire indicates low-risk usage. -- family history of breast cancer screening: done. not at high risk. -- no evidence of domestic violence or intimate partner violence. -- STD screening: gonorrhea/chlamydia NAAT not collected per patient request. -- pap  smear collected per ASCCP guidelines -- HPV vaccination series: not eligilbe   Rash on hand and arm. Patient would like to watch. Discussed conservative measures. Likely an allergic exposure.  Discussed use of benadryl topical for itching.    Thomasene MohairStephen Lukus Binion, MD 01/03/2018 6:27 PM

## 2018-01-04 ENCOUNTER — Ambulatory Visit
Admission: RE | Admit: 2018-01-04 | Discharge: 2018-01-04 | Disposition: A | Discharge status: Home / Self Care | Payer: Federal, State, Local not specified - PPO | Source: Ambulatory Visit | Attending: Obstetrics and Gynecology | Admitting: Obstetrics and Gynecology

## 2018-01-04 DIAGNOSIS — Z1231 Encounter for screening mammogram for malignant neoplasm of breast: Secondary | ICD-10-CM | POA: Diagnosis not present

## 2018-01-07 LAB — IGP, APTIMA HPV, RFX 16/18,45
HPV APTIMA: NEGATIVE
PAP Smear Comment: 0

## 2018-01-11 ENCOUNTER — Encounter: Payer: Self-pay | Admitting: Obstetrics and Gynecology

## 2018-07-29 ENCOUNTER — Other Ambulatory Visit: Payer: Self-pay

## 2018-07-29 ENCOUNTER — Encounter: Payer: Self-pay | Admitting: Emergency Medicine

## 2018-07-29 ENCOUNTER — Ambulatory Visit
Admission: EM | Admit: 2018-07-29 | Discharge: 2018-07-29 | Disposition: A | Discharge status: Home / Self Care | Payer: Federal, State, Local not specified - PPO | Attending: Family Medicine | Admitting: Family Medicine

## 2018-07-29 DIAGNOSIS — R0989 Other specified symptoms and signs involving the circulatory and respiratory systems: Secondary | ICD-10-CM | POA: Diagnosis not present

## 2018-07-29 NOTE — ED Provider Notes (Signed)
[facility]    CSN: [id number] Arrival date & time: 07/29/18  1238     History   Chief Complaint Chief Complaint  Patient presents with  . foreign body in throat    appt    HPI Kellie Cantrell is a 52 y.o. female.   52 yo female with a c/o foreign body sensation on the back of her throat since last night.  States she had this several months ago and was seen at [facility] ED for this with negative work up there and was referred to GI for an EGD. She's scheduled to see GI this month on November 12. Denies difficulty breathing but states sometimes feels like she has to yawn to get more air. Also feels like solids sometimes don't go down easy. Denies any fevers, chills, chest pain. States she takes zantac otc twice a day.   The history is provided by the patient.    Past Medical History:  Diagnosis Date  . Migraine   . Thyroid disease     There are no active problems to display for this patient.   Past Surgical History:  Procedure Laterality Date  . APPENDECTOMY    . TONSILLECTOMY    . TUBAL LIGATION      OB History    Gravida  7   Para  6   Term  6   Preterm      AB  1   Living  6     SAB      TAB      Ectopic      Multiple      Live Births  6            Home Medications    Prior to Admission medications   Medication Sig Start Date End Date Taking? Authorizing Provider  cetirizine (ZYRTEC) 10 MG tablet Take by mouth.   Yes [provider]  Diclofenac Potassium (CAMBIA) 50 MG PACK Take by mouth.   Yes [provider]  dimenhyDRINATE (DRAMAMINE) 50 MG tablet Take 50 mg by mouth every 8 (eight) hours as needed.   Yes [provider]  EPINEPHrine 0.3 mg/0.3 mL IJ SOAJ injection Inject 0.3 mg into the muscle as needed.   Yes [provider]  famotidine (PEPCID) 20 MG tablet Take 20 mg by mouth 2 (two) times daily.   Yes [provider]  hydrOXYzine (ATARAX/VISTARIL) 25 MG tablet  10/21/16  Yes  [provider]  ketotifen (ZADITOR) 0.025 % ophthalmic solution 1 drop 2 (two) times daily.   Yes [provider]  levothyroxine (SYNTHROID, LEVOTHROID) 25 MCG tablet Take 50 mcg by mouth daily before breakfast.    Yes [provider]  magnesium 30 MG tablet Take 30 mg by mouth daily.   Yes [provider]  NALTREXONE HCL PO Take by mouth.   Yes [provider]  chlorpheniramine-HYDROcodone (TUSSIONEX PENNKINETIC ER) 10-8 MG/5ML SUER Take 5 mLs by mouth every 12 (twelve) hours as needed. 11/23/17   Tommie Sams, DO  Gabapentin, Once-Daily, 600 MG TABS Take by mouth.    [provider]  ondansetron (ZOFRAN ODT) 8 MG disintegrating tablet Take 1 tablet (8 mg total) by mouth every 8 (eight) hours as needed for nausea or vomiting. Patient not taking: Reported on 01/03/2018 01/08/16   Hassan Rowan, MD  tiZANidine (ZANAFLEX) 4 MG tablet  10/21/16   [provider]    Family History Family History  Problem Relation Age of  Onset  . Cancer Mother   . Breast cancer Mother 74  . Cancer Father     Social History Social History   Tobacco Use  . Smoking status: Never Smoker  . Smokeless tobacco: Never Used  Substance Use Topics  . Alcohol use: No  . Drug use: No     Allergies   Rocephin [ceftriaxone]   Review of Systems Review of Systems   Physical Exam Triage Vital Signs ED Triage Vitals  Enc Vitals Group     BP 07/29/18 1307 (!) 131/96     Pulse Rate 07/29/18 1307 89     Resp 07/29/18 1307 17     Temp 07/29/18 1307 98.1 F (36.7 C)     Temp Source 07/29/18 1307 Oral     SpO2 07/29/18 1307 100 %     Weight 07/29/18 1259 175 lb (79.4 kg)     Height 07/29/18 1259 5\' 3"  (1.6 m)     Head Circumference --      Peak Flow --      Pain Score 07/29/18 1258 8     Pain Loc --      Pain Edu? --      Excl. in GC? --    No data found.  Updated Vital Signs BP (!) 131/96 (BP Location: Left Arm)   Pulse 89   Temp 98.1 F  (36.7 C) (Oral)   Resp 17   Ht 5\' 3"  (1.6 m)   Wt 79.4 kg   SpO2 100%   BMI 31.00 kg/m   Visual Acuity Right Eye Distance:   Left Eye Distance:   Bilateral Distance:    Right Eye Near:   Left Eye Near:    Bilateral Near:     Physical Exam  Constitutional: She appears well-developed and well-nourished. No distress.  HENT:  Head: Normocephalic and atraumatic.  Mouth/Throat: Uvula is midline, oropharynx is clear and moist and mucous membranes are normal. No oral lesions. No uvula swelling. No oropharyngeal exudate, posterior oropharyngeal edema, posterior oropharyngeal erythema or tonsillar abscesses. No tonsillar exudate.  Neck: Normal range of motion. Neck supple. No tracheal deviation present. No thyromegaly present.  Cardiovascular: Normal rate.  Pulmonary/Chest: Effort normal and breath sounds normal. No respiratory distress.  Lymphadenopathy:    She has no cervical adenopathy.  Skin: She is not diaphoretic.  Nursing note and vitals reviewed.    UC Treatments / Results  Labs (all labs ordered are listed, but only abnormal results are displayed) Labs Reviewed - No data to display  EKG None  Radiology No results found.  Procedures Procedures (including critical care time)  Medications Ordered in UC Medications - No data to display  Initial Impression / Assessment and Plan / UC Course  I have reviewed the triage vital signs and the nursing notes.  Pertinent labs & imaging results that were available during my care of the patient were reviewed by me and considered in my medical decision making (see chart for details).      Final Clinical Impressions(s) / UC Diagnoses   Final diagnoses:  Foreign body sensation in throat     Discharge Instructions     Follow up with ENT and GI as scheduled Increase zantac    ED Prescriptions    None      1. Diagnosis and possible etiologies reviewed with patient 2. Recommend increasing zantac 3. Follow up  with ENT on Monday for possible laryngoscopy and on November 12 with GI for EGD as  scheduled 4. Go to ED sooner if symptoms worsen 5. Follow-up prn     Controlled Substance Prescriptions [place] Controlled Substance Registry consulted? Not Applicable   Payton Mccallum, MD 07/29/18 1426

## 2018-07-29 NOTE — Discharge Instructions (Addendum)
Follow up with ENT and GI as scheduled Increase zantac

## 2018-07-29 NOTE — ED Triage Notes (Signed)
Pt c/o of a a foreign body sensation in her throat. She reports that this started last night after she ate dinner and then this morning after she ate breakfast. She has been seen for this in the ED and is scheduled for a EGD Nov 12 th. She took Benadryl and Ibuprofen

## 2018-08-01 ENCOUNTER — Other Ambulatory Visit: Payer: Self-pay | Admitting: Otolaryngology

## 2018-08-01 DIAGNOSIS — K222 Esophageal obstruction: Secondary | ICD-10-CM

## 2018-08-03 ENCOUNTER — Ambulatory Visit
Admission: RE | Admit: 2018-08-03 | Discharge: 2018-08-03 | Disposition: A | Discharge status: Home / Self Care | Payer: Federal, State, Local not specified - PPO | Source: Ambulatory Visit | Attending: Otolaryngology | Admitting: Otolaryngology

## 2018-08-03 DIAGNOSIS — K200 Eosinophilic esophagitis: Secondary | ICD-10-CM | POA: Diagnosis not present

## 2018-08-03 DIAGNOSIS — K222 Esophageal obstruction: Secondary | ICD-10-CM

## 2020-08-16 ENCOUNTER — Encounter: Payer: Self-pay | Admitting: Emergency Medicine

## 2020-08-16 ENCOUNTER — Ambulatory Visit
Admission: EM | Admit: 2020-08-16 | Discharge: 2020-08-16 | Disposition: A | Discharge status: Home / Self Care | Payer: Federal, State, Local not specified - PPO | Attending: Family Medicine | Admitting: Family Medicine

## 2020-08-16 ENCOUNTER — Other Ambulatory Visit: Payer: Self-pay

## 2020-08-16 DIAGNOSIS — N3001 Acute cystitis with hematuria: Secondary | ICD-10-CM | POA: Diagnosis not present

## 2020-08-16 LAB — URINALYSIS, COMPLETE (UACMP) WITH MICROSCOPIC
Bilirubin Urine: NEGATIVE
Glucose, UA: NEGATIVE mg/dL
Nitrite: POSITIVE — AB
Protein, ur: 100 mg/dL — AB
Specific Gravity, Urine: 1.025 (ref 1.005–1.030)
pH: 5.5 (ref 5.0–8.0)

## 2020-08-16 MED ORDER — NITROFURANTOIN MONOHYD MACRO 100 MG PO CAPS: 100.0000 mg | ORAL_CAPSULE | Freq: Two times a day (BID) | ORAL | 0 refills | Status: AC

## 2020-08-16 NOTE — ED Provider Notes (Signed)
[facility]    CSN: [id number] Arrival date & time: 08/16/20  0844  History   Chief Complaint Chief Complaint  Patient presents with   Urinary Urgency   Dysuria   HPI   54 year old female presents with the above complaints.  Started Wednesday. Reports urinary frequency, urgency and dysuria.  No fever.  Some mild associated abdominal pain.  Pain 5/10 in severity.  No relieving factors.  Has a history of UTI.  No other complaints.  Past Medical History:  Diagnosis Date   Migraine    Thyroid disease    Past Surgical History:  Procedure Laterality Date   APPENDECTOMY     TONSILLECTOMY     TUBAL LIGATION     OB History    Gravida  7   Para  6   Term  6   Preterm      AB  1   Living  6     SAB      TAB      Ectopic      Multiple      Live Births  6          Home Medications    Prior to Admission medications   Medication Sig Start Date End Date Taking? Authorizing Provider  cetirizine (ZYRTEC) 10 MG tablet Take by mouth.   Yes [provider]  Gabapentin, Once-Daily, 600 MG TABS Take by mouth.   Yes [provider]  magnesium 30 MG tablet Take 30 mg by mouth daily.   Yes [provider]  RABEprazole (ACIPHEX) 20 MG tablet Take by mouth. 03/14/20 03/14/21 Yes [provider]  Diclofenac Potassium (CAMBIA) 50 MG PACK Take by mouth.    [provider]  dimenhyDRINATE (DRAMAMINE) 50 MG tablet Take 50 mg by mouth every 8 (eight) hours as needed.    [provider]  EPINEPHrine 0.3 mg/0.3 mL IJ SOAJ injection Inject 0.3 mg into the muscle as needed.    [provider]  ketotifen (ZADITOR) 0.025 % ophthalmic solution 1 drop 2 (two) times daily.    [provider]  NALTREXONE HCL PO Take by mouth.    [provider]  nitrofurantoin, macrocrystal-monohydrate, (MACROBID) 100 MG capsule Take 1 capsule (100 mg total) by mouth 2 (two) times daily. 08/16/20   Tommie Sams, DO  famotidine (PEPCID) 20 MG tablet Take 20 mg by mouth 2 (two) times daily.  08/16/20  [provider]  levothyroxine (SYNTHROID, LEVOTHROID) 25 MCG tablet Take 50 mcg by mouth daily before breakfast.   08/16/20  [provider]    Family History Family History  Problem Relation Age of Onset   Cancer Mother    Breast cancer Mother 59   Cancer Father     Social History Social History   Tobacco Use   Smoking status: Never Smoker   Smokeless tobacco: Never Used  Building services engineer Use: Never used  Substance Use Topics   Alcohol use: No   Drug use: No     Allergies   Rocephin [ceftriaxone]   Review of Systems Review of Systems  Constitutional: Negative for fever.  Genitourinary: Positive for dysuria, frequency and urgency.   Physical Exam Triage Vital Signs ED Triage Vitals  Enc Vitals Group     BP 08/16/20 0913 121/87     Pulse Rate 08/16/20 0913 97     Resp 08/16/20 0913 14     Temp 08/16/20 0913 98.6  F (37 C)     Temp Source 08/16/20 0913 Oral     SpO2 08/16/20 0913 95 %     Weight 08/16/20 0909 164 lb (74.4 kg)     Height 08/16/20 0909 5\' 3"  (1.6 m)     Head Circumference --      Peak Flow --      Pain Score 08/16/20 0909 5     Pain Loc --      Pain Edu? --      Excl. in GC? --    Updated Vital Signs BP 121/87 (BP Location: Left Arm)    Pulse 97    Temp 98.6 F (37 C) (Oral)    Resp 14    Ht 5\' 3"  (1.6 m)    Wt 74.4 kg    SpO2 95%    BMI 29.05 kg/m   Visual Acuity Right Eye Distance:   Left Eye Distance:   Bilateral Distance:    Right Eye Near:   Left Eye Near:    Bilateral Near:     Physical Exam Vitals and nursing note reviewed.  Constitutional:      General: She is not in acute distress.    Appearance: Normal appearance. She is not ill-appearing.  HENT:     Head: Normocephalic and atraumatic.  Cardiovascular:     Rate and Rhythm: Normal rate and regular rhythm.     Heart sounds: No murmur heard.     Pulmonary:     Effort: Pulmonary effort is normal.     Breath sounds: Normal breath sounds. No wheezing, rhonchi or rales.  Abdominal:     General: There is no distension.     Palpations: Abdomen is soft.     Tenderness: There is no abdominal tenderness.  Neurological:     Mental Status: She is alert.  Psychiatric:        Mood and Affect: Mood normal.        Behavior: Behavior normal.    UC Treatments / Results  Labs (all labs ordered are listed, but only abnormal results are displayed) Labs Reviewed  URINALYSIS, COMPLETE (UACMP) WITH MICROSCOPIC - Abnormal; Notable for the following components:      Result Value   APPearance CLOUDY (*)    Hgb urine dipstick MODERATE (*)    Ketones, ur TRACE (*)    Protein, ur 100 (*)    Nitrite POSITIVE (*)    Leukocytes,Ua SMALL (*)    Bacteria, UA MANY (*)    All other components within normal limits  URINE CULTURE    EKG   Radiology No results found.  Procedures Procedures (including critical care time)  Medications Ordered in UC Medications - No data to display  Initial Impression / Assessment and Plan / UC Course  I have reviewed the triage vital signs and the nursing notes.  Pertinent labs & imaging results that were available during my care of the patient were reviewed by me and considered in my medical decision making (see chart for details).    54 year old female presents with UTI. Sending culture. Treating with Macrobid.  Final Clinical Impressions(s) / UC Diagnoses   Final diagnoses:  Acute cystitis with hematuria     Discharge Instructions     You have a UTI.  Antibiotic as prescribed.  Take care  Dr.    ED Prescriptions    Medication Sig Dispense Auth. Provider   nitrofurantoin, macrocrystal-monohydrate, (MACROBID) 100 MG capsule Take 1 capsule (100  mg total) by mouth 2 (two) times daily. 14 capsule Everlene Other G, DO     PDMP not reviewed this encounter.   Tommie Sams, [place] 08/16/20  1034

## 2020-08-16 NOTE — ED Triage Notes (Signed)
Patient c/o urinary urgency and discomfort after urinating that started on Wed.  Patient denies fevers.

## 2020-08-16 NOTE — Discharge Instructions (Signed)
You have a UTI.  Antibiotic as prescribed.  Take care  Dr. Veronique Warga  

## 2020-08-18 LAB — URINE CULTURE

## 2020-08-19 LAB — URINE CULTURE: Culture: >=100000 — AB

## 2020-12-26 ENCOUNTER — Other Ambulatory Visit: Payer: Self-pay | Admitting: Gerontology

## 2020-12-26 DIAGNOSIS — Z1231 Encounter for screening mammogram for malignant neoplasm of breast: Secondary | ICD-10-CM

## 2021-07-02 ENCOUNTER — Other Ambulatory Visit: Payer: Self-pay | Admitting: Gerontology

## 2021-07-02 DIAGNOSIS — Z1231 Encounter for screening mammogram for malignant neoplasm of breast: Secondary | ICD-10-CM

## 2021-07-09 ENCOUNTER — Other Ambulatory Visit (HOSPITAL_COMMUNITY): Payer: Self-pay | Admitting: Gerontology

## 2021-07-09 ENCOUNTER — Other Ambulatory Visit: Payer: Self-pay | Admitting: Gerontology

## 2021-07-09 DIAGNOSIS — G44221 Chronic tension-type headache, intractable: Secondary | ICD-10-CM

## 2021-07-09 DIAGNOSIS — M6289 Other specified disorders of muscle: Secondary | ICD-10-CM

## 2021-07-09 DIAGNOSIS — R4701 Aphasia: Secondary | ICD-10-CM

## 2021-07-09 DIAGNOSIS — R42 Dizziness and giddiness: Secondary | ICD-10-CM

## 2021-07-09 DIAGNOSIS — R110 Nausea: Secondary | ICD-10-CM

## 2021-08-22 ENCOUNTER — Ambulatory Visit
Admission: RE | Admit: 2021-08-22 | Discharge: 2021-08-22 | Disposition: A | Discharge status: Home / Self Care | Payer: Federal, State, Local not specified - PPO | Source: Ambulatory Visit | Attending: Gerontology | Admitting: Gerontology

## 2021-08-22 DIAGNOSIS — R4701 Aphasia: Secondary | ICD-10-CM | POA: Insufficient documentation

## 2021-08-22 DIAGNOSIS — M6289 Other specified disorders of muscle: Secondary | ICD-10-CM | POA: Diagnosis present

## 2021-08-22 DIAGNOSIS — G44221 Chronic tension-type headache, intractable: Secondary | ICD-10-CM

## 2021-08-22 DIAGNOSIS — R110 Nausea: Secondary | ICD-10-CM | POA: Diagnosis present

## 2021-08-22 DIAGNOSIS — R42 Dizziness and giddiness: Secondary | ICD-10-CM | POA: Insufficient documentation

## 2021-08-22 MED ORDER — GADOBUTROL 1 MMOL/ML IV SOLN
7.5000 mL | Freq: Once | INTRAVENOUS | Status: AC | PRN
  Administered 2021-08-22: 7.5 mL via INTRAVENOUS

## 2021-08-22 NOTE — Progress Notes (Signed)
Patient received Gadavist MRI contrast for brain scan today. After scan was over pt informed us of redness and itchy chest. Radiologist Dr. Jayme Cloud was informed and come to eval patient. Per radiologist recommendation pt should avoid future MRI scan with Gadavist specific contrast and other agents should be considered as a substitute.

## 2021-12-11 ENCOUNTER — Other Ambulatory Visit: Payer: Self-pay

## 2021-12-11 ENCOUNTER — Ambulatory Visit
Admission: RE | Admit: 2021-12-11 | Discharge: 2021-12-11 | Disposition: A | Discharge status: Home / Self Care | Payer: Federal, State, Local not specified - PPO | Source: Ambulatory Visit | Attending: Gerontology | Admitting: Gerontology

## 2021-12-11 DIAGNOSIS — Z1231 Encounter for screening mammogram for malignant neoplasm of breast: Secondary | ICD-10-CM | POA: Diagnosis present

## 2021-12-12 ENCOUNTER — Other Ambulatory Visit: Payer: Self-pay | Admitting: *Deleted

## 2021-12-12 ENCOUNTER — Inpatient Hospital Stay
Admission: RE | Admit: 2021-12-12 | Discharge: 2021-12-12 | Disposition: A | Discharge status: Home / Self Care | Payer: Self-pay | Source: Ambulatory Visit | Attending: *Deleted | Admitting: *Deleted

## 2021-12-12 DIAGNOSIS — Z1231 Encounter for screening mammogram for malignant neoplasm of breast: Secondary | ICD-10-CM

## 2021-12-29 ENCOUNTER — Other Ambulatory Visit: Payer: Self-pay | Admitting: Student

## 2021-12-29 DIAGNOSIS — G43719 Chronic migraine without aura, intractable, without status migrainosus: Secondary | ICD-10-CM

## 2022-01-07 ENCOUNTER — Ambulatory Visit
Admission: RE | Admit: 2022-01-07 | Discharge: 2022-01-07 | Disposition: A | Discharge status: Home / Self Care | Payer: Federal, State, Local not specified - PPO | Source: Ambulatory Visit | Attending: Student | Admitting: Student

## 2022-01-07 DIAGNOSIS — G43719 Chronic migraine without aura, intractable, without status migrainosus: Secondary | ICD-10-CM | POA: Insufficient documentation

## 2022-01-16 NOTE — Addendum Note (Signed)
Encounter addended by: Solon Augusta on: 01/16/2022 11:12 AM ? Actions taken: Imaging Exam ended

## 2022-01-16 NOTE — Addendum Note (Signed)
Encounter addended by: Royden Bulman R on: 01/16/2022 11:12 AM ? Actions taken: Imaging Exam ended

## 2022-04-18 IMAGING — MR MR MRV HEAD W/O CM
1 series · 48 of 48 positions shown · non-contrast
Comparison: Prior MRI from 08/22/2021.

CLINICAL DATA: Initial evaluation for headaches localized to top of
head and posteriorly.

EXAM:
MR VENOGRAM HEAD WITHOUT CONTRAST
TECHNIQUE: Angiographic images of the intracranial venous structures were
acquired using MRV technique without intravenous contrast.

[Series 5: TOF · coronal · 2.5mm · 0.98mm/px · 48 of 110 slices shown]
[im 1/110]
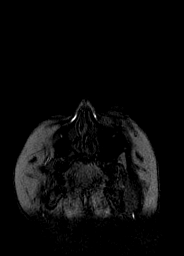
[im 3/110]
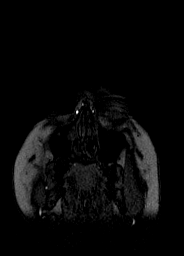
[im 5/110]
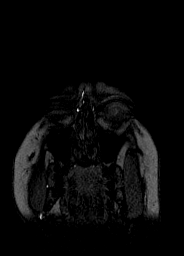
[im 7/110]
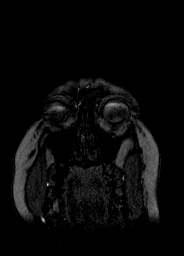
[im 10/110]
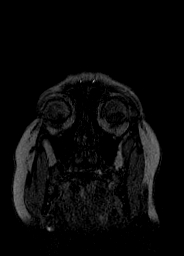
[im 12/110]
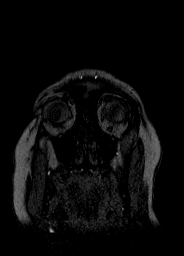
[im 14/110]
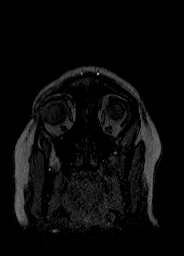
[im 17/110]
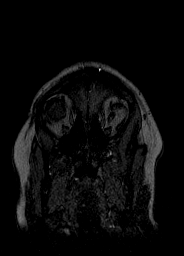
[im 19/110]
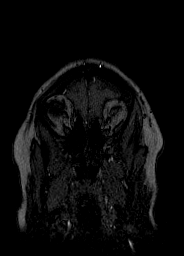
[im 21/110]
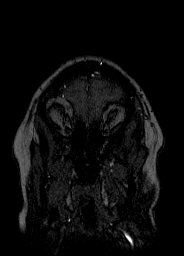
[im 24/110]
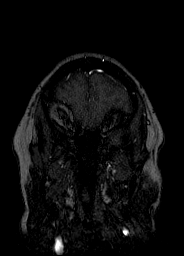
[im 26/110]
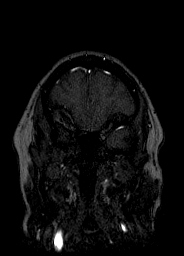
[im 28/110]
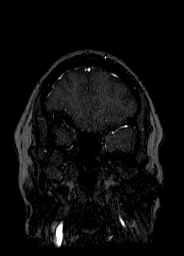
[im 31/110]
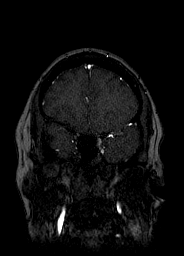
[im 33/110]
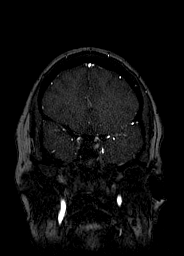
[im 35/110]
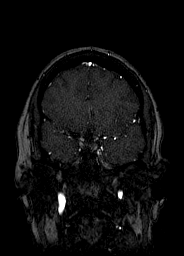
[im 38/110]
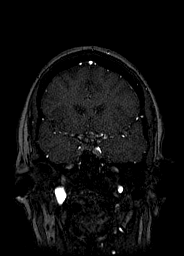
[im 40/110]
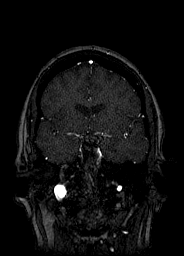
[im 42/110]
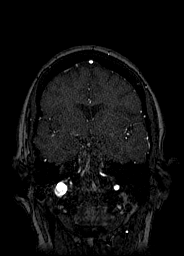
[im 45/110]
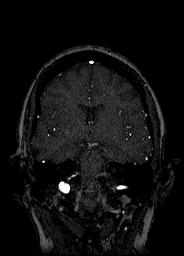
[im 47/110]
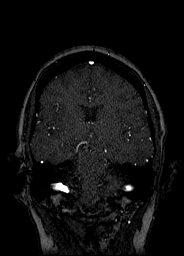
[im 49/110]
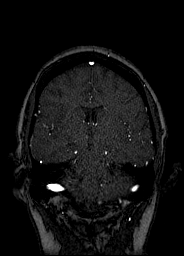
[im 52/110]
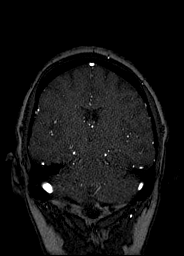
[im 54/110]
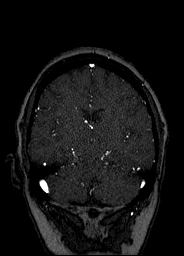
[im 56/110]
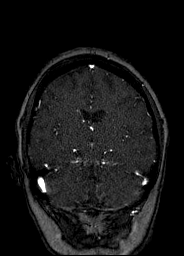
[im 58/110]
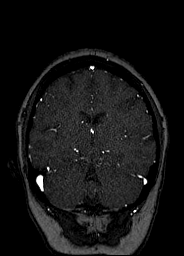
[im 61/110]
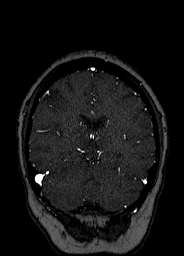
[im 63/110]
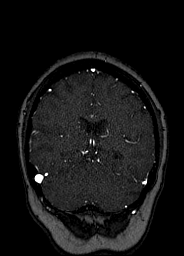
[im 65/110]
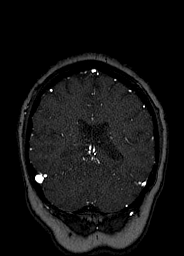
[im 68/110]
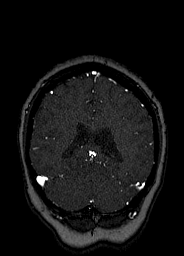
[im 70/110]
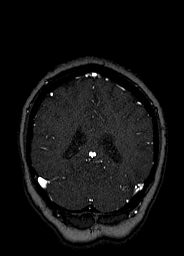
[im 72/110]
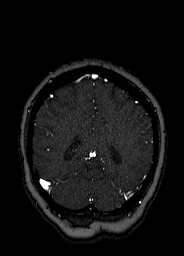
[im 75/110]
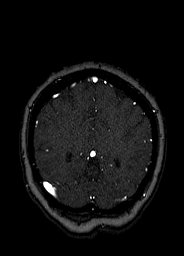
[im 77/110]
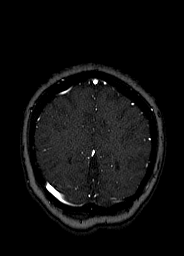
[im 79/110]
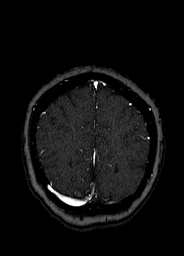
[im 82/110]
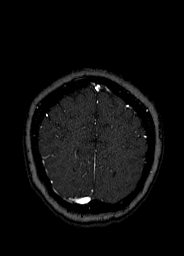
[im 84/110]
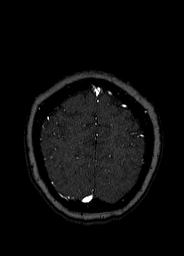
[im 86/110]
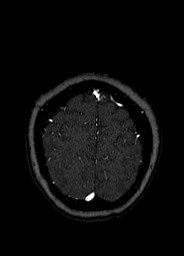
[im 89/110]
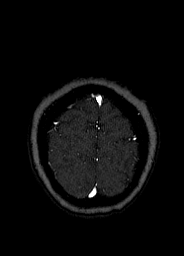
[im 91/110]
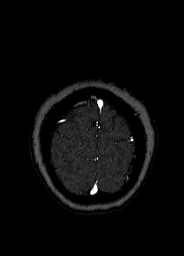
[im 93/110]
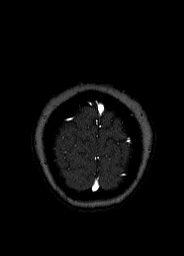
[im 96/110]
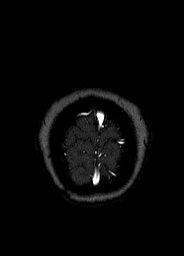
[im 98/110]
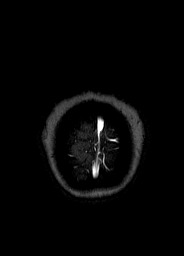
[im 100/110]
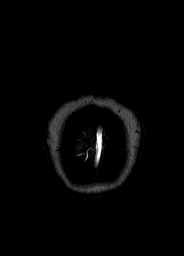
[im 103/110]
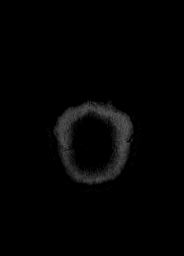
[im 105/110]
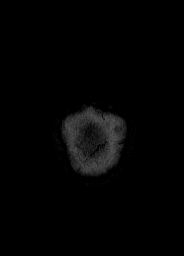
[im 107/110]
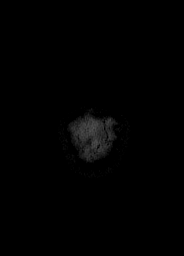
[im 110/110]
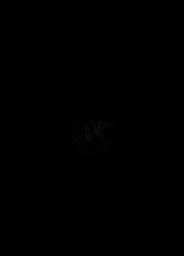

[48 of 48 positions shown; findings below may reference images not displayed]

FINDINGS: Normal flow related signal seen throughout the superior sagittal
sinus to the torcula. Right transverse sinus is strongly dominant
and widely patent. Normal flow related signal seen within the right
sigmoid sinus, right jugular bulb, and proximal right internal
jugular vein. Left transverse sinus diffusely hypoplastic but
grossly patent. Left sigmoid sinus, left jugular bulb, and proximal
left internal jugular vein patent as well. Straight sinus, vein of
Zeinab, internal cerebral veins, and basal veins of Brust are
patent. No appreciable cortical vein thrombosis.
IMPRESSION: Normal intracranial MRV. No evidence for dural sinus thrombosis.

## 2023-06-15 ENCOUNTER — Ambulatory Visit
Admission: RE | Admit: 2023-06-15 | Discharge: 2023-06-15 | Disposition: A | Discharge status: Home / Self Care | Payer: Federal, State, Local not specified - PPO | Source: Ambulatory Visit

## 2023-06-15 ENCOUNTER — Ambulatory Visit (INDEPENDENT_AMBULATORY_CARE_PROVIDER_SITE_OTHER): Payer: Federal, State, Local not specified - PPO

## 2023-06-15 VITALS — BP 133/86 | HR 71 | Temp 98.1°F | Resp 17 | Wt 180.0 lb

## 2023-06-15 DIAGNOSIS — M7732 Calcaneal spur, left foot: Secondary | ICD-10-CM | POA: Diagnosis not present

## 2023-06-15 DIAGNOSIS — M79672 Pain in left foot: Secondary | ICD-10-CM | POA: Diagnosis not present

## 2023-06-15 NOTE — Discharge Instructions (Addendum)
Your xray was negative for fracture,dislocation, degenerative changes of foot noted, you have a heel spur. Rest,ice,elevate,wear ace wrap for support. May take otc meds for pain management. Follow up with Ortho-call for appt.  [facility]: [address] [place], [place] [address] Phone: [phone] Urgent care hours 8a-7:30p Mon-Sat

## 2023-06-15 NOTE — ED Triage Notes (Signed)
Patient fell yesterday. Left foot.

## 2023-06-15 NOTE — ED Provider Notes (Signed)
[facility]    CSN: [id number] Arrival date & time: 06/15/23  1034      History   Chief Complaint Chief Complaint  Patient presents with   Foot Injury    [facility] yesterday and injured foot. Still experiencing pain, unable to bend toes - Entered by patient    HPI Kellie Cantrell is a 57 y.o. female.   57 year old female pt, Kellie Cantrell, presents to urgent care for further evaluation of left foot pain after falling last night as she was trying to avoid stepping on dogs in house. Taking OTC med for symptom management. Pain with movement of left toes, has good sensation  The history is provided by the patient. No language interpreter was used.    Past Medical History:  Diagnosis Date   Migraine    Thyroid disease     Patient Active Problem List   Diagnosis Date Noted   Heel spur, left 06/15/2023   Left foot pain 06/15/2023    Past Surgical History:  Procedure Laterality Date   APPENDECTOMY     TONSILLECTOMY     TUBAL LIGATION      OB History     Gravida  7   Para  6   Term  6   Preterm      AB  1   Living  6      SAB      IAB      Ectopic      Multiple      Live Births  6            Home Medications    Prior to Admission medications   Medication Sig Start Date End Date Taking? Authorizing Provider  levothyroxine (SYNTHROID) 50 MCG tablet Take by mouth. 08/23/22 08/23/23 Yes [provider]  cetirizine (ZYRTEC) 10 MG tablet Take by mouth.    [provider]  Diclofenac Potassium (CAMBIA) 50 MG PACK Take by mouth.    [provider]  dimenhyDRINATE (DRAMAMINE) 50 MG tablet Take 50 mg by mouth every 8 (eight) hours as needed.    [provider]  EPINEPHrine 0.3 mg/0.3 mL IJ SOAJ injection Inject 0.3 mg into the muscle as needed.    [provider]  Gabapentin, Once-Daily, 600 MG TABS Take by mouth.    [provider]  ketotifen (ZADITOR) 0.025 % ophthalmic solution 1 drop 2 (two)  times daily.    [provider]  magnesium 30 MG tablet Take 30 mg by mouth daily.    [provider]  NALTREXONE HCL PO Take by mouth.    [provider]  nitrofurantoin, macrocrystal-monohydrate, (MACROBID) 100 MG capsule Take 1 capsule (100 mg total) by mouth 2 (two) times daily. 08/16/20   Kellie Sams, DO  RABEprazole (ACIPHEX) 20 MG tablet Take by mouth. 03/14/20 03/14/21  [provider]  famotidine (PEPCID) 20 MG tablet Take 20 mg by mouth 2 (two) times daily.  08/16/20  [provider]    Family History Family History  Problem Relation Age of Onset   Cancer Mother    Breast cancer Mother 35   Cancer Father     Social History Social History   Tobacco Use   Smoking status: Never   Smokeless tobacco: Never  Vaping Use   Vaping status: Never Used  Substance Use Topics   Alcohol use: No   Drug use: No     Allergies   Other, Rocephin [ceftriaxone], Shellfish  allergy, Spinach, and Gadavist [gadobutrol]   Review of Systems Review of Systems  Musculoskeletal:  Positive for gait problem and myalgias.  Skin: Negative.   All other systems reviewed and are negative.    Physical Exam Triage Vital Signs ED Triage Vitals [06/15/23 1200]  Encounter Vitals Group     BP      Systolic BP Percentile      Diastolic BP Percentile      Pulse      Resp      Temp      Temp src      SpO2      Weight 180 lb (81.6 kg)     Height      Head Circumference      Peak Flow      Pain Score 4     Pain Loc      Pain Education      Exclude from Growth Chart    No data found.  Updated Vital Signs BP 133/86 (BP Location: Right Arm)   Pulse 71   Temp 98.1 F (36.7 C) (Oral)   Resp 17   Wt 180 lb (81.6 kg)   SpO2 97%   BMI 31.89 kg/m   Visual Acuity Right Eye Distance:   Left Eye Distance:   Bilateral Distance:    Right Eye Near:   Left Eye Near:    Bilateral Near:     Physical Exam Vitals and nursing note reviewed.   Cardiovascular:     Pulses:          Dorsalis pedis pulses are 2+ on the left side.  Musculoskeletal:       Feet:  Neurological:     General: No focal deficit present.     Mental Status: She is alert and oriented to person, place, and time.     GCS: GCS eye subscore is 4. GCS verbal subscore is 5. GCS motor subscore is 6.     Cranial Nerves: No cranial nerve deficit.     Sensory: No sensory deficit.  Psychiatric:        Attention and Perception: Attention normal.        Mood and Affect: Mood normal.        Speech: Speech normal.        Behavior: Behavior normal.      UC Treatments / Results  Labs (all labs ordered are listed, but only abnormal results are displayed) Labs Reviewed - No data to display  EKG   Radiology DG Foot Complete Left  Result Date: 06/15/2023 CLINICAL DATA:  Fall. Fell yesterday. Tripped over dog. Dorsal pain near first metatarsal bone. EXAM: LEFT FOOT - COMPLETE 3+ VIEW COMPARISON:  None Available. FINDINGS: Normal bone mineralization. Small plantar calcaneal heel spur. Mild dorsal talonavicular degenerative osteophytosis. Normal variant type 2 os naviculare. No acute fracture or dislocation. IMPRESSION: 1. No acute fracture or dislocation. 2. Mild talonavicular degenerative osteophytosis. Electronically Signed   By: Neita Garnet M.D.   On: 06/15/2023 13:58    Procedures Procedures (including critical care time)  Medications Ordered in UC Medications - No data to display  Initial Impression / Assessment and Plan / UC Course  I have reviewed the triage vital signs and the nursing notes.  Pertinent labs & imaging results that were available during my care of the patient were reviewed by me and considered in my medical decision making (see chart for details).  Clinical Course as of 06/15/23 1529  Tue Jun 15, 2023  1527 Ace wrap,negative xray.  [JD]    Clinical Course User Index [JD] Alvin Rubano, Para March, NP   Discussed exam findings and plan of  care with patient, strict go to ER precautions given.   Patient verbalized understanding to this provider.  Ddx: Left foot pain, heel spur, musculoskeletal pain. Final Clinical Impressions(s) / UC Diagnoses   Final diagnoses:  Heel spur, left  Left foot pain     Discharge Instructions      Your xray was negative for fracture,dislocation, degenerative changes of foot noted, you have a heel spur. Rest,ice,elevate,wear ace wrap for support. May take otc meds for pain management. Follow up with Ortho-call for appt.  [facility]: [address] [place], [place] [address] Phone: [phone] Urgent care hours 8a-7:30p Mon-Sat     ED Prescriptions   None    PDMP not reviewed this encounter.   Clancy Gourd, NP 06/15/23 1529

## 2023-08-10 ENCOUNTER — Ambulatory Visit: Payer: Federal, State, Local not specified - PPO | Admitting: Dermatology

## 2023-09-09 ENCOUNTER — Other Ambulatory Visit: Payer: Self-pay | Admitting: Medical Genetics

## 2023-09-13 ENCOUNTER — Ambulatory Visit: Payer: Federal, State, Local not specified - PPO | Admitting: Dermatology

## 2023-10-08 ENCOUNTER — Other Ambulatory Visit
Admission: RE | Admit: 2023-10-08 | Discharge: 2023-10-08 | Disposition: A | Discharge status: Home / Self Care | Payer: Self-pay | Source: Ambulatory Visit | Attending: Medical Genetics | Admitting: Medical Genetics

## 2023-10-08 ENCOUNTER — Other Ambulatory Visit: Payer: Self-pay

## 2023-10-19 LAB — GENECONNECT MOLECULAR SCREEN: Genetic Analysis Overall Interpretation: NEGATIVE

## 2024-09-08 ENCOUNTER — Ambulatory Visit: Payer: Self-pay
